# Patient Record
Sex: Male | Born: 1945 | Race: White | Hispanic: No | Smoking: Never smoker
Health system: Southern US, Community
[De-identification: ages and names within clinical notes are randomized; demographics above are authoritative.]

## PROBLEM LIST (undated history)

## (undated) DIAGNOSIS — I10 Essential (primary) hypertension: Secondary | ICD-10-CM

## (undated) DIAGNOSIS — I2510 Atherosclerotic heart disease of native coronary artery without angina pectoris: Secondary | ICD-10-CM

## (undated) DIAGNOSIS — I639 Cerebral infarction, unspecified: Secondary | ICD-10-CM

## (undated) DIAGNOSIS — K219 Gastro-esophageal reflux disease without esophagitis: Secondary | ICD-10-CM

---

## 2020-05-31 ENCOUNTER — Emergency Department (HOSPITAL_COMMUNITY): Admission: EM | Admit: 2020-05-31 | Payer: Medicare PPO | Source: Home / Self Care

## 2020-05-31 ENCOUNTER — Emergency Department (HOSPITAL_COMMUNITY): Payer: Medicare PPO

## 2020-05-31 ENCOUNTER — Emergency Department (HOSPITAL_BASED_OUTPATIENT_CLINIC_OR_DEPARTMENT_OTHER): Payer: Medicare PPO

## 2020-05-31 ENCOUNTER — Observation Stay (HOSPITAL_BASED_OUTPATIENT_CLINIC_OR_DEPARTMENT_OTHER)
Admission: EM | Admit: 2020-05-31 | Discharge: 2020-06-01 | Disposition: A | Discharge status: Home / Self Care | Payer: Medicare PPO | Attending: Family Medicine | Admitting: Family Medicine

## 2020-05-31 ENCOUNTER — Encounter (HOSPITAL_BASED_OUTPATIENT_CLINIC_OR_DEPARTMENT_OTHER): Payer: Self-pay | Admitting: Emergency Medicine

## 2020-05-31 ENCOUNTER — Other Ambulatory Visit: Payer: Self-pay

## 2020-05-31 DIAGNOSIS — Z7982 Long term (current) use of aspirin: Secondary | ICD-10-CM | POA: Insufficient documentation

## 2020-05-31 DIAGNOSIS — Z20822 Contact with and (suspected) exposure to covid-19: Secondary | ICD-10-CM | POA: Insufficient documentation

## 2020-05-31 DIAGNOSIS — Z888 Allergy status to other drugs, medicaments and biological substances status: Secondary | ICD-10-CM | POA: Insufficient documentation

## 2020-05-31 DIAGNOSIS — I480 Paroxysmal atrial fibrillation: Secondary | ICD-10-CM | POA: Diagnosis not present

## 2020-05-31 DIAGNOSIS — Z882 Allergy status to sulfonamides status: Secondary | ICD-10-CM | POA: Insufficient documentation

## 2020-05-31 DIAGNOSIS — G459 Transient cerebral ischemic attack, unspecified: Principal | ICD-10-CM | POA: Diagnosis present

## 2020-05-31 DIAGNOSIS — I10 Essential (primary) hypertension: Secondary | ICD-10-CM

## 2020-05-31 DIAGNOSIS — Z8673 Personal history of transient ischemic attack (TIA), and cerebral infarction without residual deficits: Secondary | ICD-10-CM | POA: Diagnosis not present

## 2020-05-31 DIAGNOSIS — R531 Weakness: Secondary | ICD-10-CM | POA: Diagnosis present

## 2020-05-31 DIAGNOSIS — Z79899 Other long term (current) drug therapy: Secondary | ICD-10-CM | POA: Insufficient documentation

## 2020-05-31 DIAGNOSIS — I2510 Atherosclerotic heart disease of native coronary artery without angina pectoris: Secondary | ICD-10-CM

## 2020-05-31 DIAGNOSIS — R202 Paresthesia of skin: Secondary | ICD-10-CM

## 2020-05-31 HISTORY — DX: Gastro-esophageal reflux disease without esophagitis: K21.9

## 2020-05-31 HISTORY — DX: Essential (primary) hypertension: I10

## 2020-05-31 HISTORY — DX: Atherosclerotic heart disease of native coronary artery without angina pectoris: I25.10

## 2020-05-31 HISTORY — DX: Cerebral infarction, unspecified: I63.9

## 2020-05-31 LAB — ETHANOL: Alcohol, Ethyl (B): <10 mg/dL (ref <10)

## 2020-05-31 LAB — CBC
HCT: 44.3 % (ref 39.0–52.0)
Hemoglobin: 15.8 g/dL (ref 13.0–17.0)
MCH: 30.4 pg (ref 26.0–34.0)
MCHC: 35.7 g/dL (ref 30.0–36.0)
MCV: 85.2 fL (ref 80.0–100.0)
Platelets: 179 10*3/uL (ref 150–400)
RBC: 5.2 MIL/uL (ref 4.22–5.81)
RDW: 13.2 % (ref 11.5–15.5)
WBC: 10.5 10*3/uL (ref 4.0–10.5)
nRBC: 0 % (ref 0.0–0.2)

## 2020-05-31 LAB — RESP PANEL BY RT-PCR (FLU A&B, COVID) ARPGX2
Influenza A by PCR: NEGATIVE
Influenza B by PCR: NEGATIVE
SARS Coronavirus 2 by RT PCR: NEGATIVE

## 2020-05-31 LAB — DIFFERENTIAL
Abs Immature Granulocytes: 0.03 10*3/uL (ref 0.00–0.07)
Basophils Absolute: 0.1 10*3/uL (ref 0.0–0.1)
Basophils Relative: 1 %
Eosinophils Absolute: 0.3 10*3/uL (ref 0.0–0.5)
Eosinophils Relative: 3 %
Immature Granulocytes: 0 %
Lymphocytes Relative: 19 %
Lymphs Abs: 2 10*3/uL (ref 0.7–4.0)
Monocytes Absolute: 0.6 10*3/uL (ref 0.1–1.0)
Monocytes Relative: 5 %
Neutro Abs: 7.6 10*3/uL (ref 1.7–7.7)
Neutrophils Relative %: 72 %

## 2020-05-31 LAB — COMPREHENSIVE METABOLIC PANEL
ALT: 18 U/L (ref 0–44)
AST: 27 U/L (ref 15–41)
Albumin: 3.8 g/dL (ref 3.5–5.0)
Alkaline Phosphatase: 79 U/L (ref 38–126)
Anion gap: 10 (ref 5–15)
BUN: 27 mg/dL — ABNORMAL HIGH (ref 8–23)
CO2: 21 mmol/L — ABNORMAL LOW (ref 22–32)
Calcium: 8.7 mg/dL — ABNORMAL LOW (ref 8.9–10.3)
Chloride: 105 mmol/L (ref 98–111)
Creatinine, Ser: 1.2 mg/dL (ref 0.61–1.24)
GFR, Estimated: >60 mL/min (ref >60)
Glucose, Bld: 130 mg/dL — ABNORMAL HIGH (ref 70–99)
Potassium: 3.5 mmol/L (ref 3.5–5.1)
Sodium: 136 mmol/L (ref 135–145)
Total Bilirubin: 0.5 mg/dL (ref 0.3–1.2)
Total Protein: 7 g/dL (ref 6.5–8.1)

## 2020-05-31 LAB — APTT: aPTT: 29 seconds (ref 24–36)

## 2020-05-31 LAB — URINALYSIS, ROUTINE W REFLEX MICROSCOPIC
Bilirubin Urine: NEGATIVE
Glucose, UA: NEGATIVE mg/dL
Hgb urine dipstick: NEGATIVE
Ketones, ur: NEGATIVE mg/dL
Leukocytes,Ua: NEGATIVE
Nitrite: NEGATIVE
Protein, ur: NEGATIVE mg/dL
Specific Gravity, Urine: 1.02 (ref 1.005–1.030)
pH: 5.5 (ref 5.0–8.0)

## 2020-05-31 LAB — RAPID URINE DRUG SCREEN, HOSP PERFORMED
Amphetamines: NOT DETECTED
Barbiturates: NOT DETECTED
Benzodiazepines: NOT DETECTED
Cocaine: NOT DETECTED
Opiates: NOT DETECTED
Tetrahydrocannabinol: NOT DETECTED

## 2020-05-31 LAB — PROTIME-INR
INR: 1 (ref 0.8–1.2)
Prothrombin Time: 13 seconds (ref 11.4–15.2)

## 2020-05-31 IMAGING — MR MR HEAD W/O CM
10 of 12 series · 32 of 48 positions shown · non-contrast
Comparison: Head CT earlier same day.

CLINICAL DATA: Extremity weakness beginning abruptly. Left facial
numbness. Some slurred speech.

EXAM:
MRI HEAD WITHOUT CONTRAST
MRA HEAD WITHOUT CONTRAST
TECHNIQUE: Multiplanar, multiecho pulse sequences of the brain and surrounding
structures were obtained without intravenous contrast. Angiographic
images of the head were obtained using MRA technique without
contrast.

[Series 5: DWI · axial · 3.0mm · 0.88mm/px · z∈[-71,+79]mm · 7 of 102 slices shown (1 of 4)]
[im 1/102]
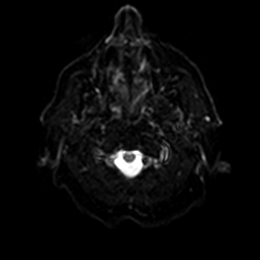
[im 17/102]
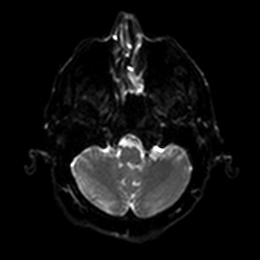
[im 34/102]
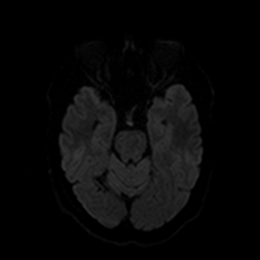
[im 51/102]
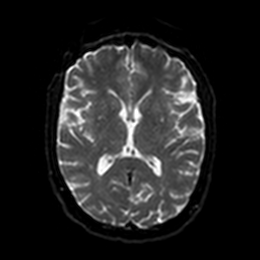
[im 68/102]
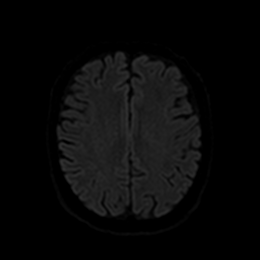
[im 85/102]
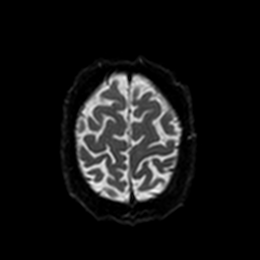
[im 102/102]
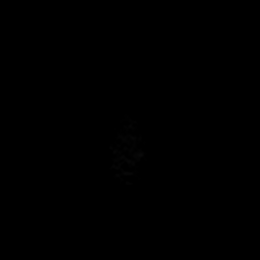

[Series 6: DWI · axial · 3.0mm · 0.88mm/px · z∈[-71,+79]mm · 3 of 48 slices shown (2 of 4)]
[im 1/48]
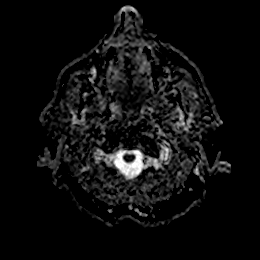
[im 24/48]
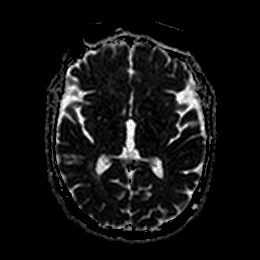
[im 48/48]
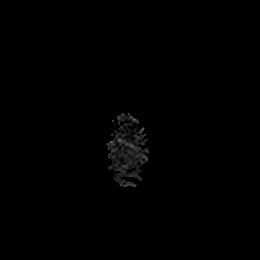

[Series 7: DWI · coronal · 4.0mm · 0.88mm/px · 4 of 64 slices shown (3 of 4)]
[im 1/64]
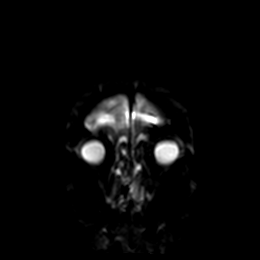
[im 22/64]
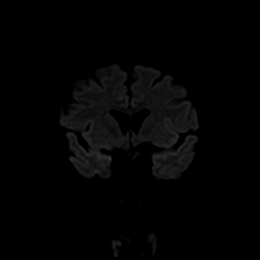
[im 43/64]
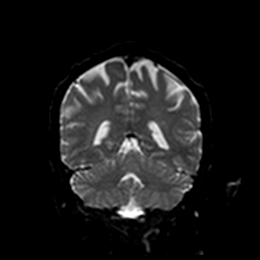
[im 64/64]
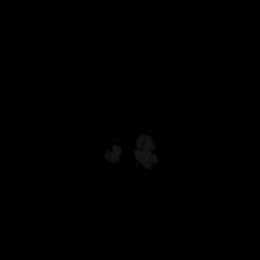

[Series 8: DWI · coronal · 4.0mm · 0.88mm/px · 2 of 32 slices shown (4 of 4)]
[im 1/32]
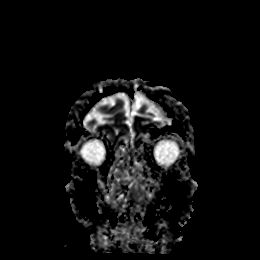
[im 32/32]
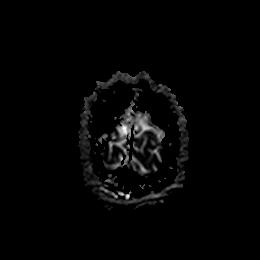

[Series 13: T1 · sagittal · 5.0mm · 0.75mm/px · 2 of 23 slices shown]
[im 1/23]
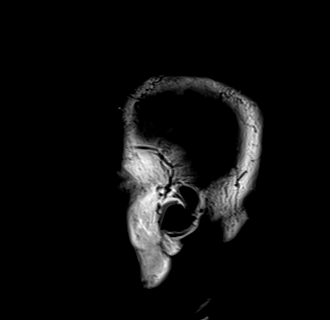
[im 23/23]
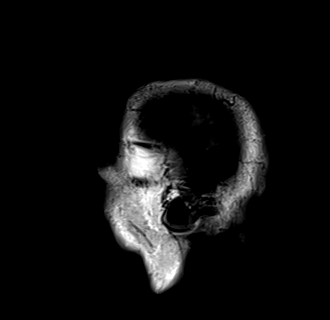

[Series 14: T2 · axial · 5.0mm · 0.72mm/px · z∈[-63,+81]mm · 2 of 25 slices shown (1 of 2)]
[im 1/25]
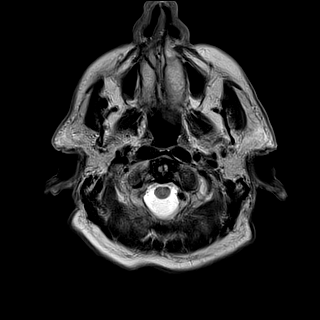
[im 25/25]
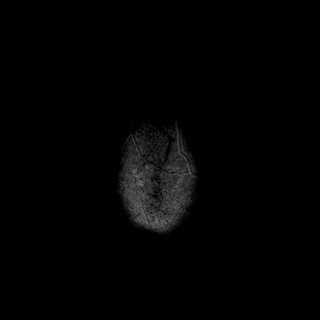

[Series 15: FLAIR · axial · 5.0mm · 0.45mm/px · z∈[-61,+82]mm · 2 of 25 slices shown]
[im 1/25]
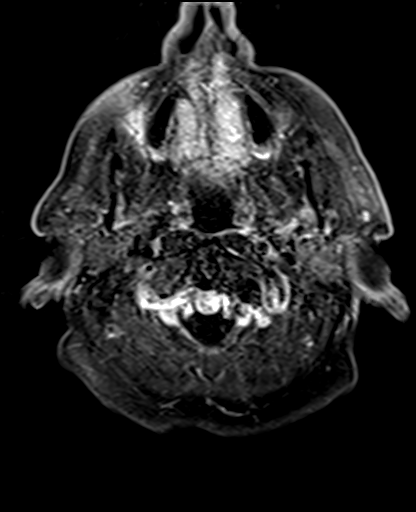
[im 25/25]
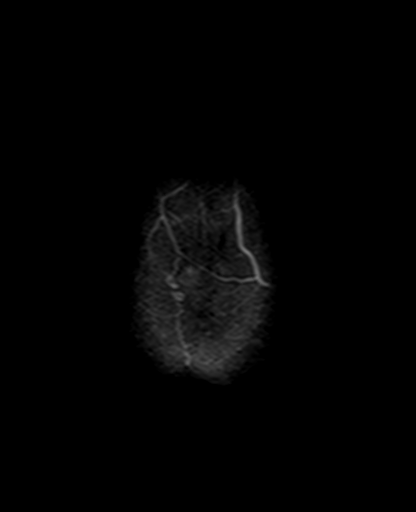

[Series 17: pha_images · axial · 3.0mm · 0.90mm/px · z∈[-78,+93]mm · 4 of 57 slices shown]
[im 1/57]
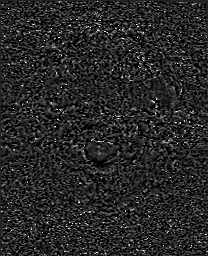
[im 19/57]
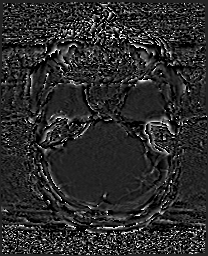
[im 38/57]
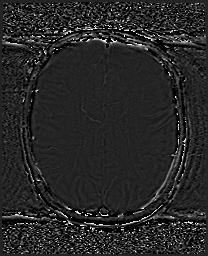
[im 57/57]
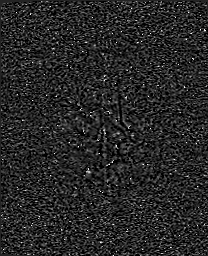

[Series 18: swi_images · axial · 3.0mm · 0.90mm/px · z∈[-78,+99]mm · 4 of 60 slices shown]
[im 1/60]
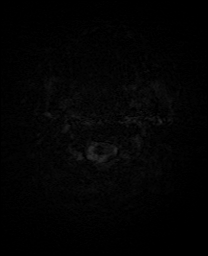
[im 20/60]
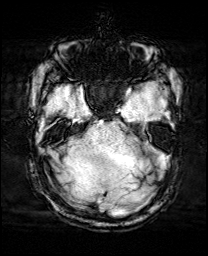
[im 40/60]
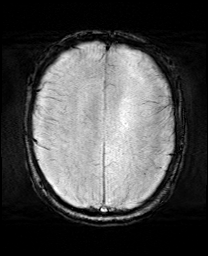
[im 60/60]
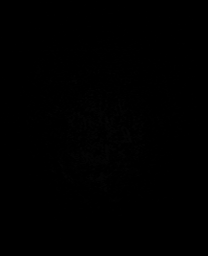

[Series 21: T2 · coronal · 5.0mm · 0.34mm/px · 2 of 29 slices shown (2 of 2)]
[im 1/29]
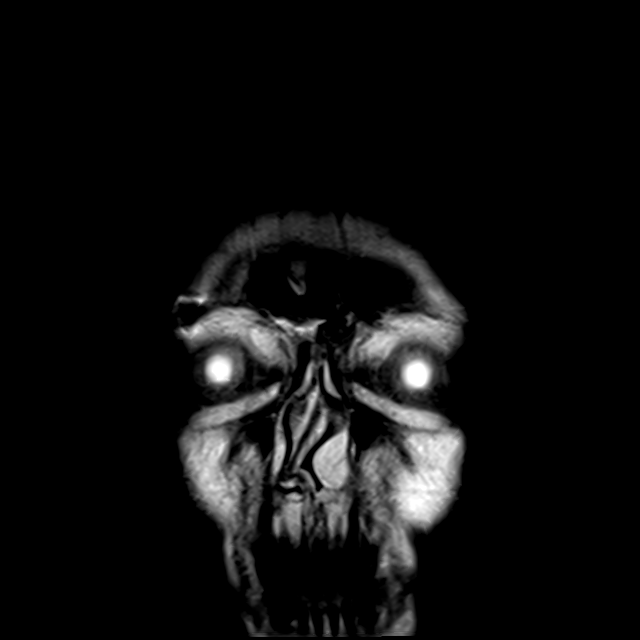
[im 29/29]
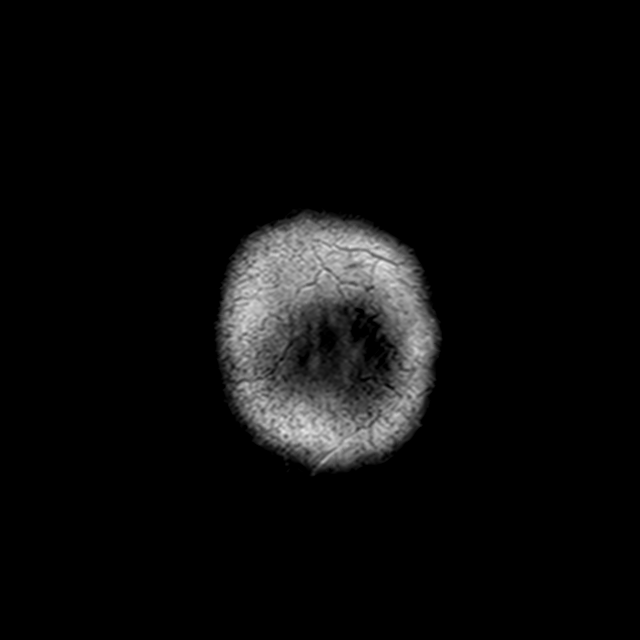

[32 of 48 positions shown; findings below may reference images not displayed]

FINDINGS: MRI HEAD FINDINGS

Brain: Diffusion imaging does not show any acute or subacute
infarction. No focal brainstem abnormality. There are old small
vessel cerebellar infarctions affecting both cerebellar hemispheres.
Cerebral hemispheres show minimal small vessel change of the white
matter. No large vessel territory supratentorial stroke. No mass,
hemorrhage, hydrocephalus or extra-axial collection.

Vascular: Major vessels at the base of the brain show flow.

Skull and upper cervical spine: Negative

Sinuses/Orbits: Clear/normal

Other: None

MRA HEAD FINDINGS

Both internal carotid arteries are widely patent into the brain. No
siphon stenosis. The anterior and middle cerebral vessels are patent
without proximal stenosis, aneurysm or vascular malformation.

Both vertebral arteries are widely patent to the basilar. No basilar
stenosis. Posterior circulation branch vessels appear normal.
IMPRESSION: 1. No acute finding by MRI. Old small vessel cerebellar infarctions.
Minimal small vessel change of the hemispheric white matter.
2. Normal intracranial MR angiography of the large and medium size
vessels.

## 2020-05-31 IMAGING — MR MR MRA HEAD W/O CM
7 of 8 series · 32 of 48 positions shown · non-contrast
Comparison: None.

CLINICAL DATA: Extremity weakness.  Left facial droop.

EXAM:
MRA HEAD WITHOUT CONTRAST
TECHNIQUE: Angiographic images of the Circle of Willis were obtained using MRA
technique without intravenous contrast.

[Series 5: DWI · axial · 3.0mm · 0.88mm/px · z∈[-71,+79]mm · 10 of 102 slices shown (1 of 4)]
[im 1/102]
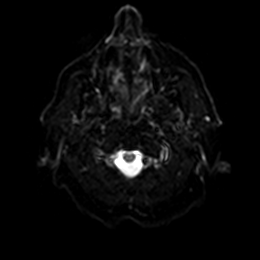
[im 12/102]
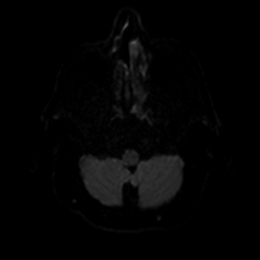
[im 23/102]
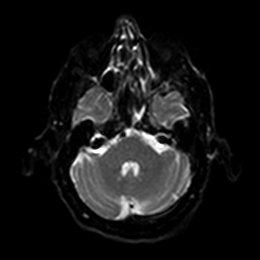
[im 34/102]
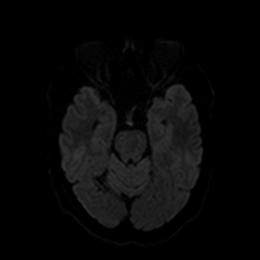
[im 45/102]
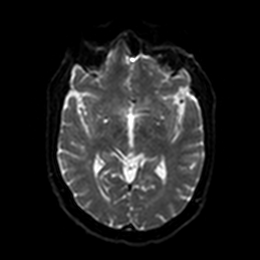
[im 57/102]
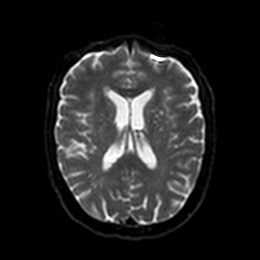
[im 68/102]
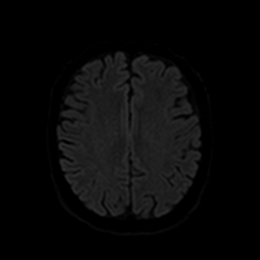
[im 79/102]
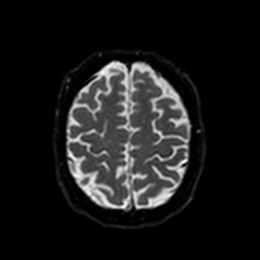
[im 90/102]
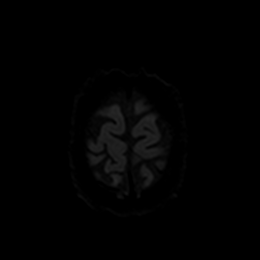
[im 102/102]
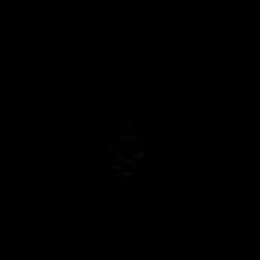

[Series 6: DWI · axial · 3.0mm · 0.88mm/px · z∈[-71,+79]mm · 4 of 48 slices shown (2 of 4)]
[im 1/48]
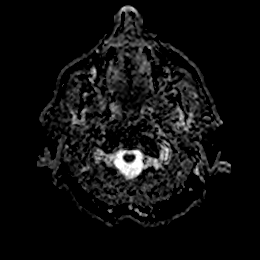
[im 16/48]
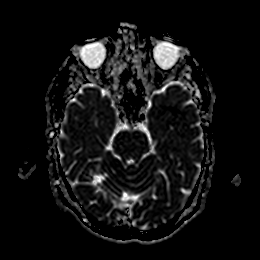
[im 32/48]
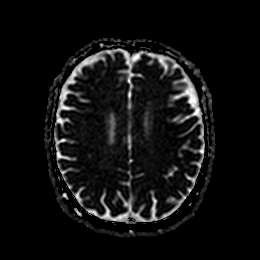
[im 48/48]
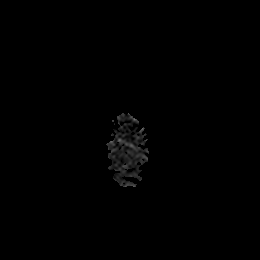

[Series 7: DWI · coronal · 4.0mm · 0.88mm/px · 6 of 64 slices shown (3 of 4)]
[im 1/64]
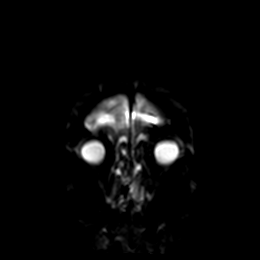
[im 13/64]
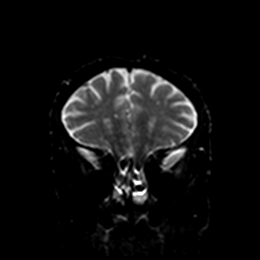
[im 26/64]
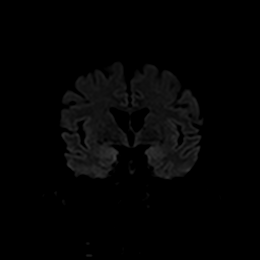
[im 38/64]
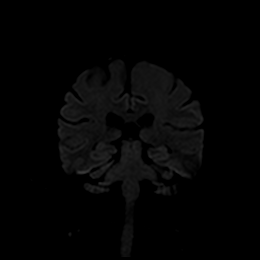
[im 51/64]
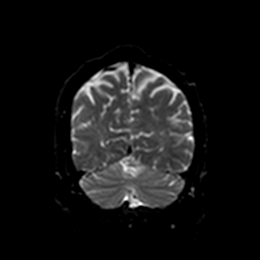
[im 64/64]
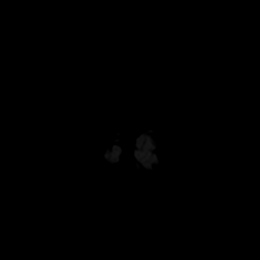

[Series 8: DWI · coronal · 4.0mm · 0.88mm/px · 3 of 32 slices shown (4 of 4)]
[im 1/32]
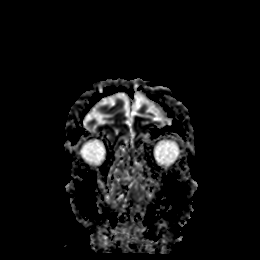
[im 16/32]
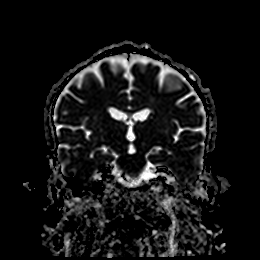
[im 32/32]
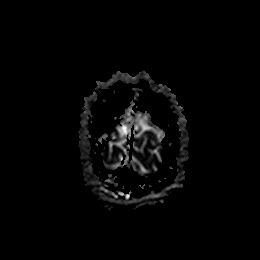

[Series 13: T1 · sagittal · 5.0mm · 0.75mm/px · 2 of 23 slices shown]
[im 1/23]
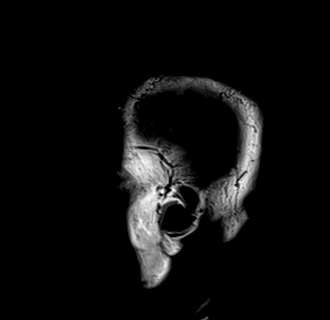
[im 23/23]
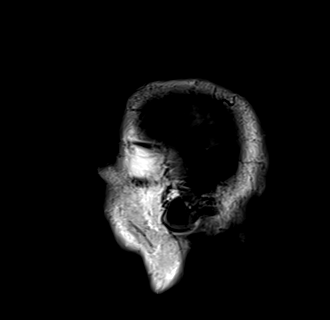

[Series 14: T2 · axial · 5.0mm · 0.72mm/px · z∈[-63,+81]mm · 2 of 25 slices shown]
[im 1/25]
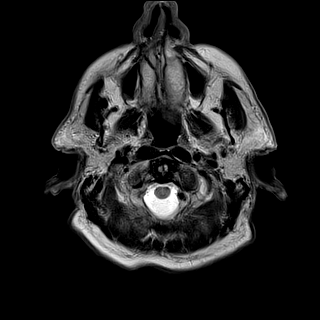
[im 25/25]
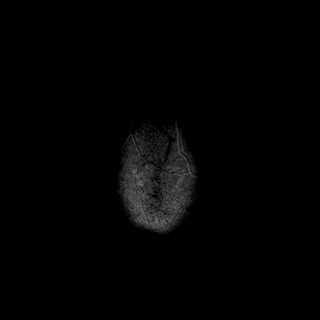

[Series 17: pha_images · axial · 3.0mm · 0.90mm/px · z∈[-78,+93]mm · 5 of 57 slices shown]
[im 1/57]
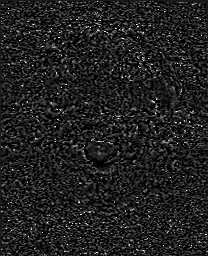
[im 15/57]
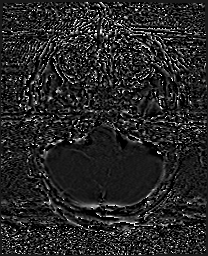
[im 29/57]
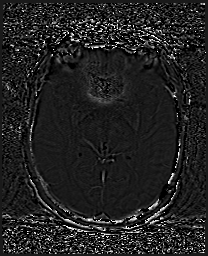
[im 43/57]
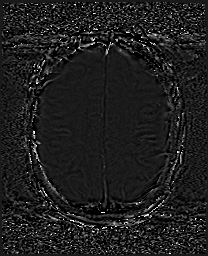
[im 57/57]
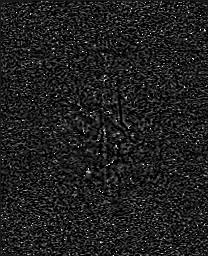

[32 of 48 positions shown; findings below may reference images not displayed]

FINDINGS: POSTERIOR CIRCULATION:

--Vertebral arteries: Normal

--Inferior cerebellar arteries: Normal.

--Basilar artery: Normal.

--Superior cerebellar arteries: Normal.

--Posterior cerebral arteries: Normal.

ANTERIOR CIRCULATION:

--Intracranial internal carotid arteries: Normal.

--Anterior cerebral arteries (ACA): Normal.

--Middle cerebral arteries (MCA): Normal.

ANATOMIC VARIANTS: None
IMPRESSION: Normal intracranial MRA.

## 2020-05-31 IMAGING — CT CT HEAD W/O CM
3 series · 16 of 47 positions shown, 19 images · non-contrast
Comparison: None.

CLINICAL DATA: Left-sided facial numbness and right arm weakness

EXAM:
CT HEAD WITHOUT CONTRAST
TECHNIQUE: Contiguous axial images were obtained from the base of the skull
through the vertex without intravenous contrast.

[Series 2: head wo · axial · 0.41mm/px · z∈[-169,-34]mm · 10 of 33 slices shown, 13 images]
[im 3/33  brain]
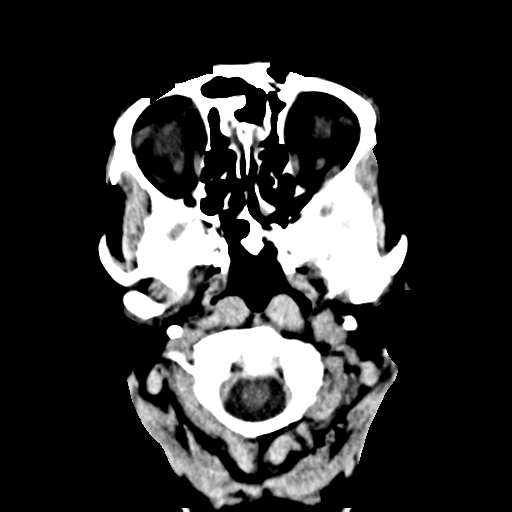
[im 3/33  bone]
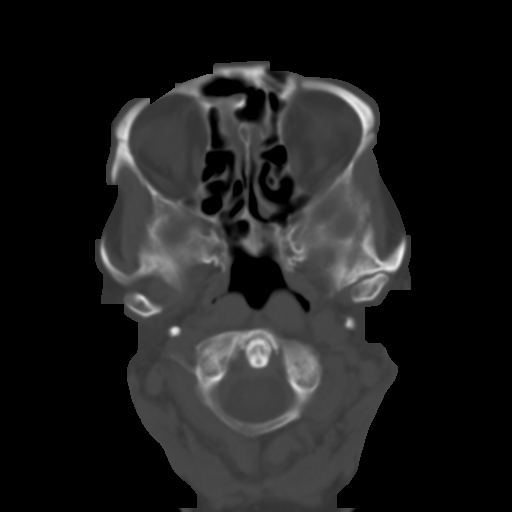
[im 6/33  brain]
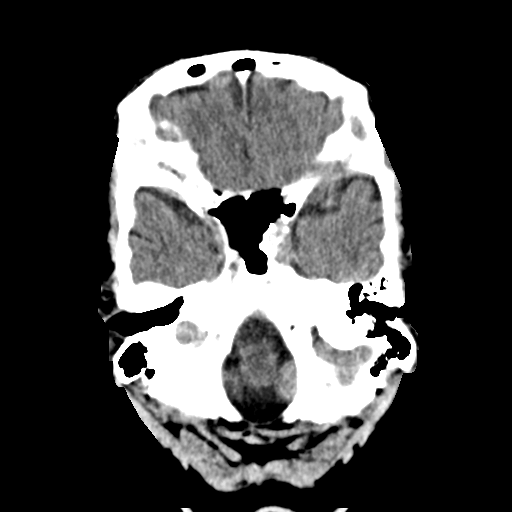
[im 9/33  brain]
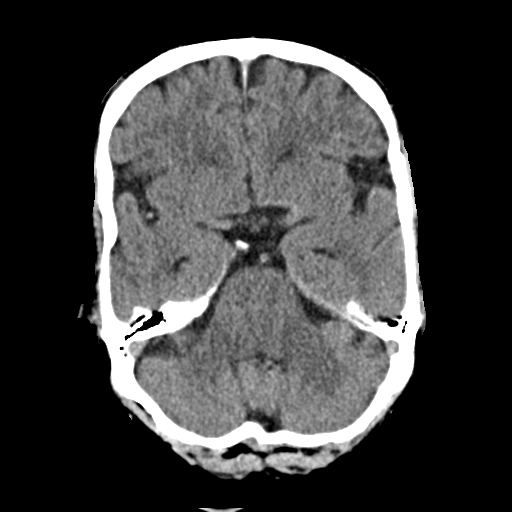
[im 12/33  brain]
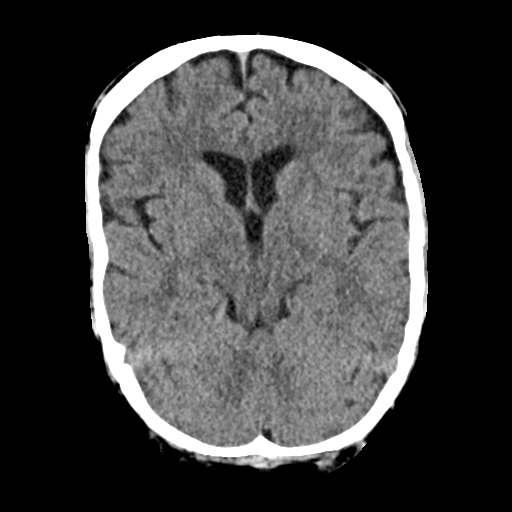
[im 15/33  brain]
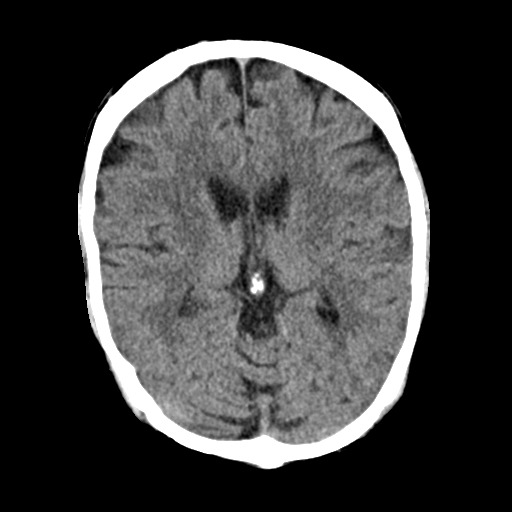
[im 15/33  bone]
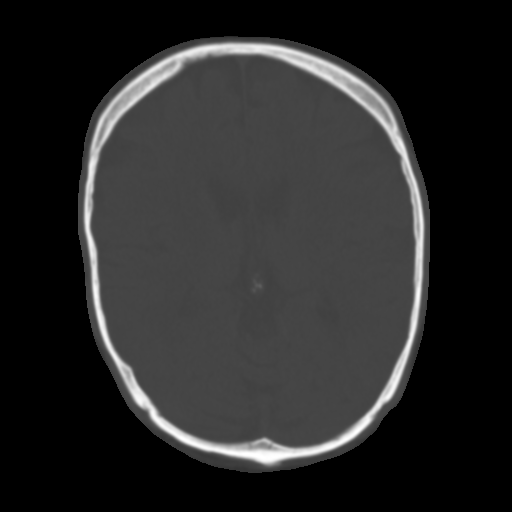
[im 18/33  brain]
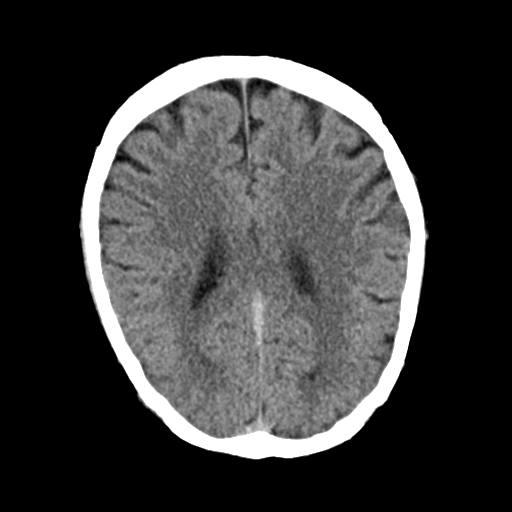
[im 21/33  brain]
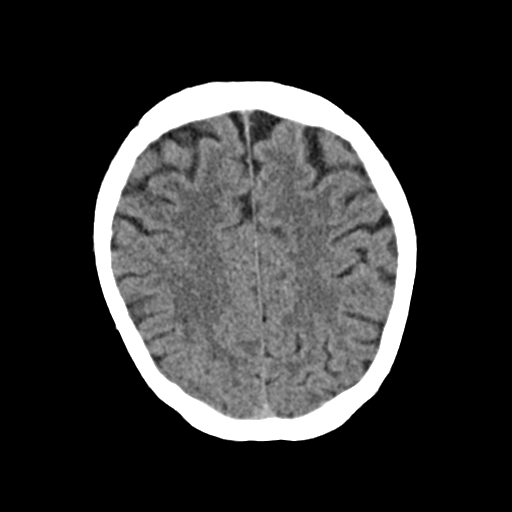
[im 25/33  brain]
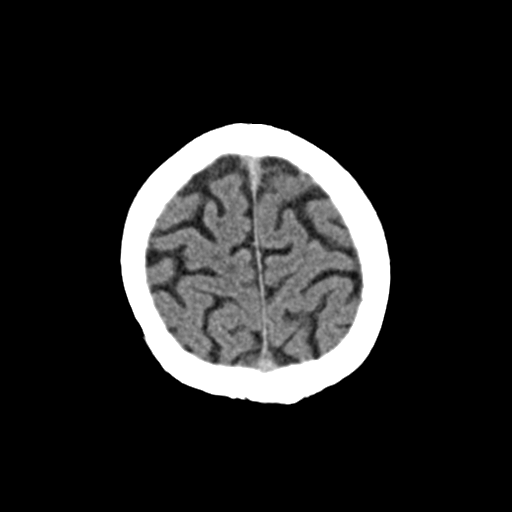
[im 27/33  brain]
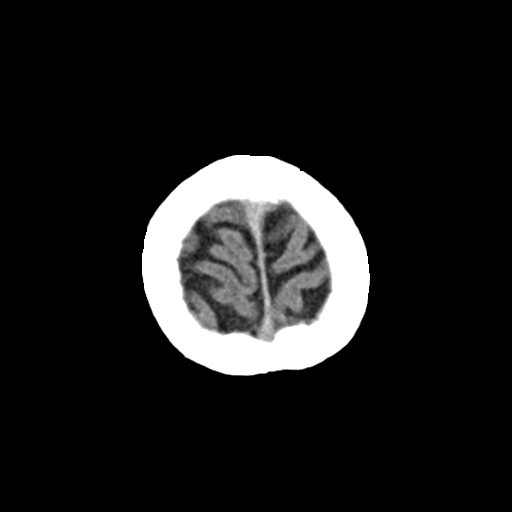
[im 27/33  bone]
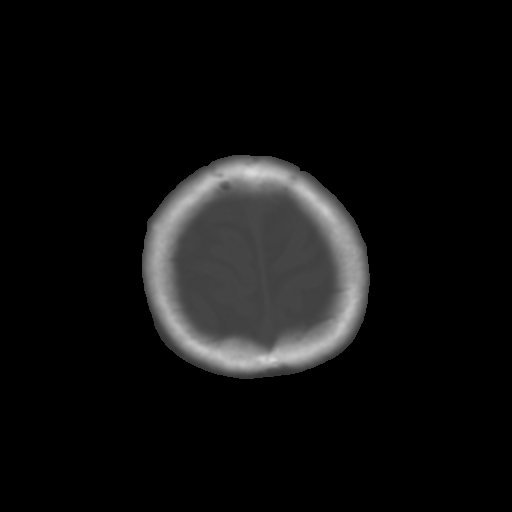
[im 30/33  brain]
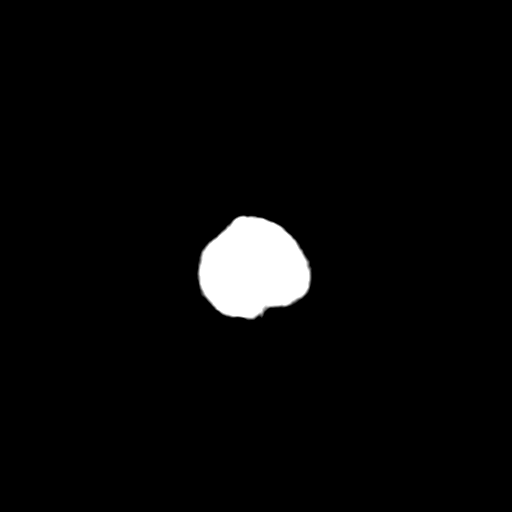

[Series 4: coronal soft · coronal · 0.33mm/px · 3 of 66 slices shown]
[im 22/66  brain]
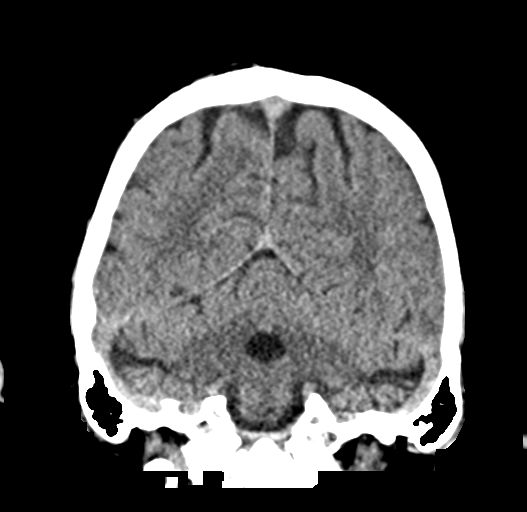
[im 29/66  brain]
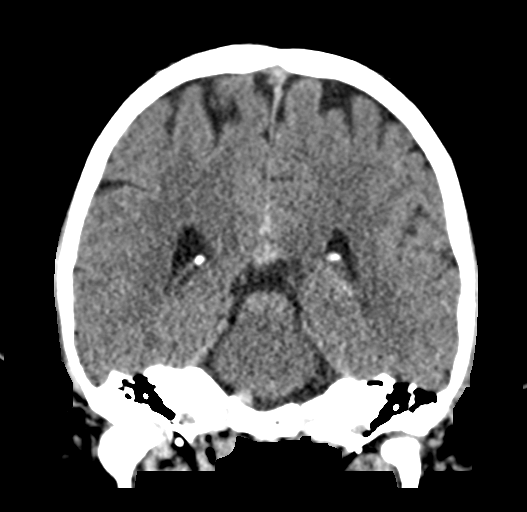
[im 37/66  brain]
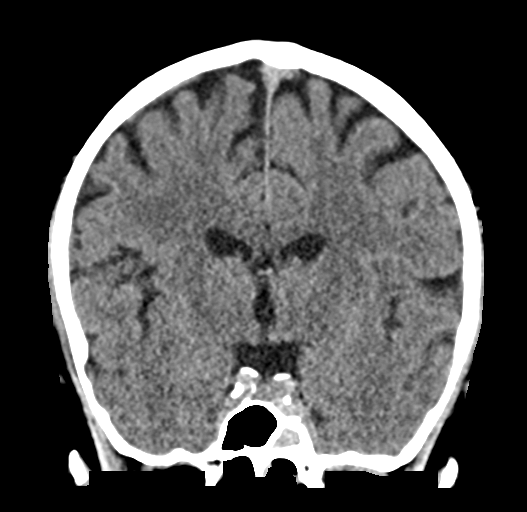

[Series 5: sag soft · sagittal · 0.33mm/px · 3 of 53 slices shown]
[im 18/53  brain]
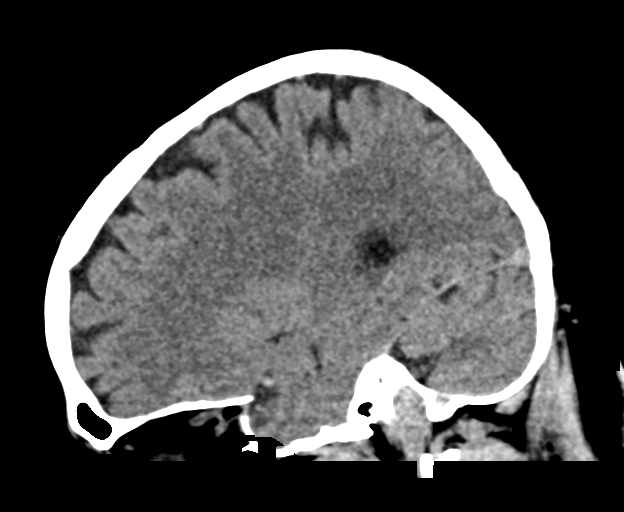
[im 27/53  brain]
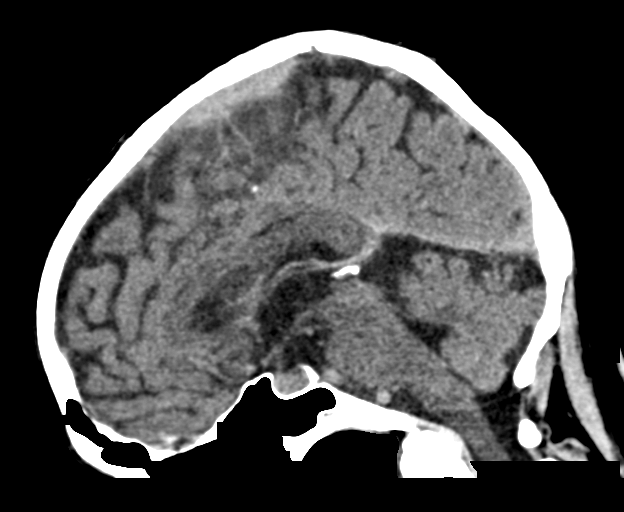
[im 35/53  brain]
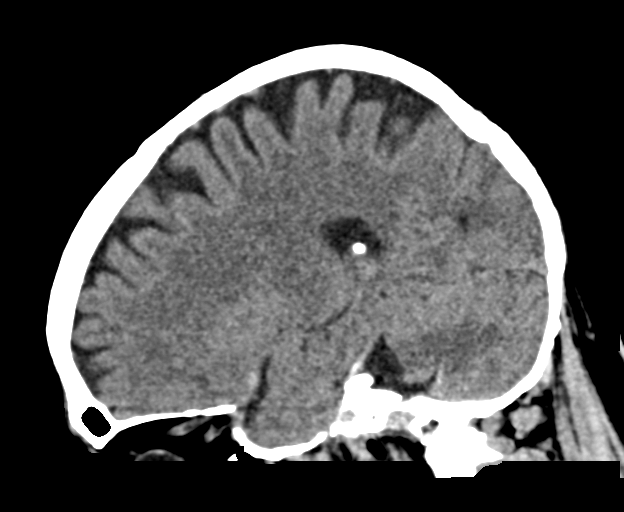

[16 of 47 positions shown; findings below may reference images not displayed]

FINDINGS: Brain: No evidence of acute infarction, hemorrhage, hydrocephalus,
extra-axial collection or mass lesion/mass effect.

Vascular: Atherosclerotic calcifications involving the large vessels
of the skull base. No unexpected hyperdense vessel.

Skull: Normal. Negative for fracture or focal lesion.

Sinuses/Orbits: Scattered mucosal thickening within the bilateral
ethmoid air cells and left sphenoid sinus.

Other: None.
IMPRESSION: 1. No acute intracranial findings.
2. Mild paranasal sinus disease.

## 2020-05-31 MED ORDER — ACETAMINOPHEN 325 MG PO TABS: 650.0000 mg | ORAL_TABLET | ORAL | Status: DC | PRN

## 2020-05-31 MED ORDER — ACETAMINOPHEN 160 MG/5ML PO SOLN: 650.0000 mg | ORAL | Status: DC | PRN

## 2020-05-31 MED ORDER — SENNOSIDES-DOCUSATE SODIUM 8.6-50 MG PO TABS: 1.0000 | ORAL_TABLET | Freq: Every evening | ORAL | Status: DC | PRN

## 2020-05-31 MED ORDER — ENOXAPARIN SODIUM 40 MG/0.4ML ~~LOC~~ SOLN: 40.0000 mg | Freq: Every day | SUBCUTANEOUS | Status: DC

## 2020-05-31 MED ORDER — STROKE: EARLY STAGES OF RECOVERY BOOK: Freq: Once | Status: DC

## 2020-05-31 MED ORDER — ACETAMINOPHEN 650 MG RE SUPP
650.0000 mg | RECTAL | Status: DC | PRN
Start: 2020-05-31 — End: 2020-06-01

## 2020-05-31 NOTE — ED Notes (Signed)
Phone Handoff Report given to Jasper Riling at [employer]

## 2020-05-31 NOTE — Consult Note (Addendum)
[facility] Telemedicine Consult  Consult Requested by: Dr. Rubin Payor  Reason for Consult: TIA  Consult Date: 05/31/20   The history was obtained from the pt.  During history and examination, all items were able to obtain unless otherwise noted.  History of Present Illness:  Troy Powers is a 75 y.o. Caucasian male with PMH of HTN, HLD, stroke in 2008 with left side symptoms but resolved over time, CAD s/p stent and PAF on ASA presented to ER for transient right hand weakness and left facial numbness. He stated that he was doing well at 5am and then when he tried to pick up the coffee pot using right hand, he dropped to pot due to weakness. That lasted briefly and resolved. Then he went to work and he felt left facial numbness lasted about . Co-worker also felt he had slurry speech which he did not notice. He was sent to ER for evaluation. In ER, all symptoms resolved. CT negative.   Pt has PAF which documented on his cardiology note on 05/07/16. He was started on amiodarone but did not mention anticoagulation. Per pt, he was on [facility] before (not known the name but said maybe plavix??) but caused his to have easy bleeding and he then was changed to ASA 81.   LSN: 5am tPA Given: No: outside window, symptoms resolved.   Past Medical History:  Diagnosis Date  . Coronary artery disease   . GERD (gastroesophageal reflux disease)   . Hypertension   . Stroke (cerebrum) [facility])     History reviewed. No pertinent surgical history.  History reviewed. No pertinent family history.  Social History:  reports that he has never smoked. He has never used smokeless tobacco. He reports previous alcohol use. He reports that he does not use drugs.  Allergies:  Allergies  Allergen Reactions  . Iodine   . Lisinopril   . Sulfa Antibiotics     No current facility-administered medications on file prior to encounter.   No current outpatient medications on file prior to encounter.    Review  of Systems: A full ROS was attempted today and was able to be performed.  Systems assessed include - Constitutional, Eyes, HENT, Respiratory, Cardiovascular, Gastrointestinal, Genitourinary, Integument/breast, Hematologic/lymphatic, Musculoskeletal, Neurological, Behavioral/Psych, Endocrine, Allergic/Immunologic - with pertinent responses as per HPI.  Physical Examination: Temp:  [97.6 F (36.4 C)] 97.6 F (36.4 C) (03/10 1052) Pulse Rate:  [58-70] 59 (03/10 1328) Resp:  [18-22] 18 (03/10 1328) BP: (135-165)/(76-98) 165/98 (03/10 1328) SpO2:  [95 %-100 %] 99 % (03/10 1328) Weight:  [74.8 kg] 74.8 kg (03/10 1053)  General - well nourished, well developed, in no apparent distress.    Ophthalmologic - fundi not visualized due to noncooperation.    Cardiovascular - regular rhythm and rate  Mental Status -  Level of arousal and orientation to time, place, and person were intact. Language including expression, naming, repetition, comprehension, reading was assessed and found intact. Fund of Knowledge was assessed and was intact.  Cranial Nerves II - XII - II - Vision intact OU. III, IV, VI - Extraocular movements intact. V - Facial sensation intact bilaterally. VII - mild right nasolabial fold flattening. VIII - Hearing & vestibular intact bilaterally. X - Palate elevates symmetrically. XI - Chin turning & shoulder shrug intact bilaterally. XII - Tongue protrusion intact.  Motor Strength - The patient's strength was normal in all extremities and pronator drift was absent.   Motor Tone & Bulk - Muscle tone was assessed at the  neck and appendages and was normal.  Bulk was normal and fasciculations were absent.   Reflexes - The patient's reflexes were normal in all extremities and he had no pathological reflexes.  Sensory - Light touch, temperature/pinprick were assessed and were normal.    Coordination - The patient had normal movements in the hands with no ataxia or dysmetria.   Tremor was absent.  Gait and Station - deferred  NIH Stroke Scale  Level Of Consciousness 0=Alert; keenly responsive 1=Arouse to minor stimulation 2=Requires repeated stimulation to arouse or movements to pain 3=postures or unresponsive 0  LOC Questions to Month and Age 38=Answers both questions correctly 1=Answers one question correctly or dysarthria/intubated/trauma/language barrier 2=Answers neither question correctly or aphasia 0  LOC Commands      -Open/Close eyes     -Open/close grip     -Pantomime commands if communication barrier 0=Performs both tasks correctly 1=Performs one task correctly 2=Performs neighter task correctly 0  Best Gaze     -Only assess horizontal gaze 0=Normal 1=Partial gaze palsy 2=Forced deviation, or total gaze paresis 0  Visual 0=No visual loss 1=Partial hemianopia 2=Complete hemianopia 3=Bilateral hemianopia (blind including cortical blindness) 0  Facial Palsy     -Use grimace if obtunded 0=Normal symmetrical movement 1=Minor paralysis (asymmetry) 2=Partial paralysis (lower face) 3=Complete paralysis (upper and lower face) 1  Motor  0=No drift for 10/5 seconds 1=Drift, but does not hit bed 2=Some antigravity effort, hits  bed 3=No effort against gravity, limb falls 4=No movement 0=Amputation/joint fusion Right Arm 0     Leg 0    Left Arm 0     Leg 0  Limb Ataxia     - FNT/HTS 0=Absent or does not understand or paralyzed or amputation/joint fusion 1=Present in one limb 2=Present in two limbs 0  Sensory 0=Normal 1=Mild to moderate sensory loss 2=Severe to total sensory loss or coma/unresponsive 0  Best Language 0=No aphasia, normal 1=Mild to moderate aphasia 2=Severe aphasia 3=Mute, global aphasia, or coma/unresponsive 0  Dysarthria 0=Normal 1=Mild to moderate 2=Severe, unintelligible or mute/anarthric 0=intubated/unable to test 0  Extinction/Neglect 0=No abnormality 1=visual/tactile/auditory/spatia/personal inattention/Extinction  to bilateral simultaneous stimulation 2=Profound neglect/extinction more than 1 modality  0  Total   1     Data Reviewed: CT HEAD WO CONTRAST  Result Date: 05/31/2020 CLINICAL DATA:  Left-sided facial numbness and right arm weakness EXAM: CT HEAD WITHOUT CONTRAST TECHNIQUE: Contiguous axial images were obtained from the base of the skull through the vertex without intravenous contrast. COMPARISON:  None. FINDINGS: Brain: No evidence of acute infarction, hemorrhage, hydrocephalus, extra-axial collection or mass lesion/mass effect. Vascular: Atherosclerotic calcifications involving the large vessels of the skull base. No unexpected hyperdense vessel. Skull: Normal. Negative for fracture or focal lesion. Sinuses/Orbits: Scattered mucosal thickening within the bilateral ethmoid air cells and left sphenoid sinus. Other: None. IMPRESSION: 1. No acute intracranial findings. 2. Mild paranasal sinus disease. Electronically Signed   By: Duanne Guess D.O.   On: 05/31/2020 12:45    Assessment: 75 y.o. male with PMH of HTN, HLD, stroke in 2008 with left side symptoms but resolved over time, CAD s/p stent and PAF on ASA presented to ER for transient right hand weakness and left facial numbness. Time onset 5am, currently symptoms all resolved. NIHSS=1, due to right nasolabial fold flattening, could be his baseline. Pt not tPA candidate given outside window. CT no acute finding. Given pt risk factors, recommend MRI brain, CTA head and neck, with TTE for full stroke work up. After work  up, will need to discuss about anticoagulation given PAF not on [facility] with hx of stroke.   Plan: - Transfer to [facility] for further stroke work up  - Frequent neuro checks - Telemetry monitoring - MRI brain  - CTA head and neck - Echocardiogram  - fasting lipid panel and HgbA1C - Continue ASA for now. Will need to discuss about [facility] after stroke work up - Discussed with Dr. Rubin Payor ED physician - Stroke team to follow  Consult  Participants: me and RN Location of the provider: [facility] Location of the patient: [facility]  This consult was provided via telemedicine with 2-way video and audio communication. The patient/family was informed that care would be provided in this way and agreed to receive care in this manner.   This patient is receiving care for possible acute neurological changes. There was 50 minutes of care by this provider at the time of service, including time for direct evaluation via telemedicine, review of medical records, imaging studies and discussion of findings with providers, the patient and/or family.  Marvel Plan, MD PhD Stroke Neurology 05/31/2020 2:26 PM

## 2020-05-31 NOTE — ED Notes (Signed)
Tele Neuro at bedside 

## 2020-05-31 NOTE — ED Notes (Signed)
MD notified that patient has expressed wishes to go home instead of being admitted.

## 2020-05-31 NOTE — ED Notes (Signed)
ED Provider at bedside. 

## 2020-05-31 NOTE — ED Notes (Signed)
Pt given instruction on how to get to [facility] ED, driven by his coworker, verbalizes importance of staying to get MRI. NAD. Ambulatory to coworkers car

## 2020-05-31 NOTE — H&P (Signed)
History and Physical    Troy Powers WPY:[id number] DOB: [birth year] DOA: 05/31/2020  PCP: Patient, No Pcp Per   Patient coming from: Work  Chief Complaint: Left facial numbness and weakness of right hand  HPI: Troy Powers is a 75 y.o. male with medical history significant for  coronary artery disease, GERD, hypertension, CVA (prior left-sided deficits, resolved) presents with complaint of left facial numbness and right hand weakness this am.  Patient states that he started this morning at 5 AM when he was having his usual cup of coffee and then when he was cleaning up he dropped the coffee pot and noticed that his right arm was numb and right hand was weak. The weakness lasted a few minutes then resolved. Patient got into the car to drive here from [place] for work in [place], states along the drive noticed that the left side of his face felt numb.  This lasted for about 10 minutes he states then the symptoms resolved. Patient arrived at work, his colleagues asked how he was feeling and he states he did not feel very well. Patient's colleagues encouraged him to come to the emergency room, told him that his speech was not normal. He states that symptoms have completely resolved at this time.  He denies changes in gait, currently reports normal speech and normal from strength.  No other complaints or concerns. He has hx of previous stroke a few years ago and has hx of CAD and had stent placed in coronary artery a few years ago. States he feels at his baseline level with no numbness or weakness at this time. Has had MRI head that was negative for acute CVA. Had MRA head which was negative  Review of Systems:  General: Denies fever, chills, weight loss, night sweats.  Denies dizziness.  Denies change in appetite HENT: Denies head trauma, headache, denies change in hearing, tinnitus.  Denies nasal congestion or bleeding.  Denies sore throat, sores in mouth.  Denies difficulty swallowing Eyes: Denies  blurry vision, pain in eye, drainage.  Denies discoloration of eyes. Neck: Denies pain.  Denies swelling.  Denies pain with movement. Cardiovascular: Denies chest pain, palpitations.  Denies edema.  Denies orthopnea Respiratory: Denies shortness of breath, cough.  Denies wheezing.  Denies sputum production Gastrointestinal: Denies abdominal pain, swelling.  Denies nausea, vomiting, diarrhea.  Denies melena.  Denies hematemesis. Musculoskeletal: Denies limitation of movement.  Denies deformity or swelling.  Denies pain.  Denies arthralgias or myalgias. Genitourinary: Denies pelvic pain.  Denies urinary frequency or hesitancy.  Denies dysuria.  Skin: Denies rash.  Denies petechiae, purpura, ecchymosis. Neurological: Reports left facial numbness and right hand weakness this am that lasted for 5-10 minutes then resolved spontaneously. Denies headache.  Denies syncope.  Denies seizure activity. Denies slurred speech, drooping face.  Denies visual change. Psychiatric: Denies depression, anxiety. Denies hallucinations.  Past Medical History:  Diagnosis Date  . Coronary artery disease   . GERD (gastroesophageal reflux disease)   . Hypertension   . Stroke (cerebrum) [facility])     History reviewed. No pertinent surgical history.  Social History  reports that he has never smoked. He has never used smokeless tobacco. He reports previous alcohol use. He reports that he does not use drugs.  Allergies  Allergen Reactions  . Iodine   . Lisinopril   . Sulfa Antibiotics     History reviewed. No pertinent family history.   Prior to Admission medications   Not on File    Physical Exam:  Vitals:   05/31/20 2045 05/31/20 2130 05/31/20 2215 05/31/20 2301  BP: (!) 142/92 138/60 (!) 159/87 (!) 160/113  Pulse: (!) 56 (!) 52 (!) 59 (!) 56  Resp: 12 15 14 17   Temp:      TempSrc:      SpO2: 96% 93% 97% 97%  Weight:      Height:        Constitutional: NAD, calm, comfortable Vitals:   05/31/20 2045  05/31/20 2130 05/31/20 2215 05/31/20 2301  BP: (!) 142/92 138/60 (!) 159/87 (!) 160/113  Pulse: (!) 56 (!) 52 (!) 59 (!) 56  Resp: 12 15 14 17   Temp:      TempSrc:      SpO2: 96% 93% 97% 97%  Weight:      Height:       General: WDWN, Alert and oriented x3.  Eyes: EOMI, PERRL, conjunctivae normal.  Sclera nonicteric HENT:  [place]/AT, external ears normal.  Nares patent without epistasis.  Mucous membranes are moist. Posterior pharynx clear of any exudate or lesions.  Neck: Soft, normal range of motion, supple, no masses, no thyromegaly. Trachea midline Respiratory: clear to auscultation bilaterally, no wheezing, no crackles. Normal respiratory effort. No accessory muscle use.  Cardiovascular: Regular rate and rhythm, no murmurs / rubs / gallops. No extremity edema. 2+ pedal pulses. No carotid bruits.   Abdomen: Soft, no tenderness, nondistended, no rebound or guarding.  No masses palpated. Bowel sounds normoactive Musculoskeletal: FROM. No cyanosis. Normal muscle tone.  Skin: Warm, dry, intact no rashes, lesions, ulcers. No induration Neurologic: CN 2-12 grossly intact.  Normal speech.  Sensation intact, patella DTR +1 bilaterally. Strength 5/5 in all extremities.   Psychiatric: Normal judgment and insight.  Normal mood.    Labs on Admission: I have personally reviewed following labs and imaging studies  CBC: Recent Labs  Lab 05/31/20 1143  WBC 10.5  NEUTROABS 7.6  HGB 15.8  HCT 44.3  MCV 85.2  PLT 179    Basic Metabolic Panel: Recent Labs  Lab 05/31/20 1143  NA 136  K 3.5  CL 105  CO2 21*  GLUCOSE 130*  BUN 27*  CREATININE 1.20  CALCIUM 8.7*    GFR: Estimated Creatinine Clearance: 50.5 mL/min (by C-G formula based on SCr of 1.2 mg/dL).  Liver Function Tests: Recent Labs  Lab 05/31/20 1143  AST 27  ALT 18  ALKPHOS 79  BILITOT 0.5  PROT 7.0  ALBUMIN 3.8    Urine analysis:    Component Value Date/Time   COLORURINE YELLOW 05/31/2020 1143   APPEARANCEUR  CLEAR 05/31/2020 1143   LABSPEC 1.020 05/31/2020 1143   PHURINE 5.5 05/31/2020 1143   GLUCOSEU NEGATIVE 05/31/2020 1143   HGBUR NEGATIVE 05/31/2020 1143   BILIRUBINUR NEGATIVE 05/31/2020 1143   KETONESUR NEGATIVE 05/31/2020 1143   PROTEINUR NEGATIVE 05/31/2020 1143   NITRITE NEGATIVE 05/31/2020 1143   LEUKOCYTESUR NEGATIVE 05/31/2020 1143    Radiological Exams on Admission: CT HEAD WO CONTRAST  Result Date: 05/31/2020 CLINICAL DATA:  Left-sided facial numbness and right arm weakness EXAM: CT HEAD WITHOUT CONTRAST TECHNIQUE: Contiguous axial images were obtained from the base of the skull through the vertex without intravenous contrast. COMPARISON:  None. FINDINGS: Brain: No evidence of acute infarction, hemorrhage, hydrocephalus, extra-axial collection or mass lesion/mass effect. Vascular: Atherosclerotic calcifications involving the large vessels of the skull base. No unexpected hyperdense vessel. Skull: Normal. Negative for fracture or focal lesion. Sinuses/Orbits: Scattered mucosal thickening within the bilateral ethmoid air cells and left  sphenoid sinus. Other: None. IMPRESSION: 1. No acute intracranial findings. 2. Mild paranasal sinus disease. Electronically Signed   By: Duanne GuessNicholas  Plundo D.O.   On: 05/31/2020 12:45   MR ANGIO HEAD WO CONTRAST  Result Date: 05/31/2020 CLINICAL DATA:  Extremity weakness.  Left facial droop. EXAM: MRA HEAD WITHOUT CONTRAST TECHNIQUE: Angiographic images of the Circle of Willis were obtained using MRA technique without intravenous contrast. COMPARISON:  None. FINDINGS: POSTERIOR CIRCULATION: --Vertebral arteries: Normal --Inferior cerebellar arteries: Normal. --Basilar artery: Normal. --Superior cerebellar arteries: Normal. --Posterior cerebral arteries: Normal. ANTERIOR CIRCULATION: --Intracranial internal carotid arteries: Normal. --Anterior cerebral arteries (ACA): Normal. --Middle cerebral arteries (MCA): Normal. ANATOMIC VARIANTS: None IMPRESSION: Normal  intracranial MRA. Electronically Signed   By: Deatra RobinsonKevin  Herman M.D.   On: 05/31/2020 20:16   MR BRAIN WO CONTRAST  Result Date: 05/31/2020 CLINICAL DATA:  Extremity weakness beginning abruptly. Left facial numbness. Some slurred speech. EXAM: MRI HEAD WITHOUT CONTRAST MRA HEAD WITHOUT CONTRAST TECHNIQUE: Multiplanar, multiecho pulse sequences of the brain and surrounding structures were obtained without intravenous contrast. Angiographic images of the head were obtained using MRA technique without contrast. COMPARISON:  Head CT earlier same day. FINDINGS: MRI HEAD FINDINGS Brain: Diffusion imaging does not show any acute or subacute infarction. No focal brainstem abnormality. There are old small vessel cerebellar infarctions affecting both cerebellar hemispheres. Cerebral hemispheres show minimal small vessel change of the white matter. No large vessel territory supratentorial stroke. No mass, hemorrhage, hydrocephalus or extra-axial collection. Vascular: Major vessels at the base of the brain show flow. Skull and upper cervical spine: Negative Sinuses/Orbits: Clear/normal Other: None MRA HEAD FINDINGS Both internal carotid arteries are widely patent into the brain. No siphon stenosis. The anterior and middle cerebral vessels are patent without proximal stenosis, aneurysm or vascular malformation. Both vertebral arteries are widely patent to the basilar. No basilar stenosis. Posterior circulation branch vessels appear normal. IMPRESSION: 1. No acute finding by MRI. Old small vessel cerebellar infarctions. Minimal small vessel change of the hemispheric white matter. 2. Normal intracranial MR angiography of the large and medium size vessels. Electronically Signed   By: Paulina FusiMark  Shogry M.D.   On: 05/31/2020 20:13    EKG: Independently reviewed.  EKG shows normal sinus rhythm with no acute ST change but no acute ST elevation or depression.  QTc 445  Assessment/Plan Principal Problem:   TIA (transient ischemic  attack) Patient be observed on medical telemetry floor for TIA/CVA.  MRI head and MRA head and neck has already been obtained and no acute CVA identified. Obtain echocardiogram to evaluate for PFO, wall motion and ejection fraction. Hypertension of 220/110 will be allowed for 24 hours per stroke protocol.  After which blood pressure will be slowly reduced to goal level. Antiplatelet therapy with aspirin daily. Check lipid panel  Neurochecks per stroke protocol  Active Problems:   Essential hypertension Monitor BP and will make medication recommendation in am after tracking blood pressure readings overnight.     CAD (coronary artery disease) Aspirin daily. Check lipid panel    DVT prophylaxis: Lovenox for DVT prophylaxis Code Status:   Full code Family Communication:  Gnosis and plan discussed with patient.  Patient verbalized understanding agrees with plan.  Initially states he does not want to stay and considers leaving AGAINST MEDICAL ADVICE but after discussion with both myself and the ER physician he has decided to stay to complete work-up Disposition Plan:   Patient is from:  Home  Anticipated DC to:  Home  Anticipated DC  date:  Anticipate less than 2 midnight stay in the hospital  Anticipated DC barriers: No barriers to discharge identified at this time  Consults called:  Neurology, Dr. Otelia Limes Admission status:  Observation   Claudean Severance Chotiner MD [facility]  How to contact the [facility] Attending or Consulting provider 7A - 7P or covering provider during after hours 7P -7A, for this patient?   1. Check the care team in [facility] and look for a) attending/consulting [facility] provider listed and b) the [facility] team listed 2. Log into [email or web address] and use [facility]'s universal password to access. If you do not have the password, please contact the hospital operator. 3. Locate the [facility] provider you are looking for under [facility] and page to a number that you can be directly  reached. 4. If you still have difficulty reaching the provider, please page the [facility] (Director on Call) for the Hospitalists listed on amion for assistance.  05/31/2020, 11:27 PM

## 2020-05-31 NOTE — ED Provider Notes (Signed)
[facility] EMERGENCY DEPARTMENT Provider Note   CSN: [id number] Arrival date & time: 05/31/20  1035     History Chief Complaint  Patient presents with  . Weakness    Troy Powers is a 75 y.o. male.  75 year old male with past medical history of coronary artery disease, GERD, hypertension, CVA (prior left-sided deficits, resolved) presents with complaint of not feeling well today.  Patient states that he started this morning at 5 AM, had his usual cup of coffee and then when he was cleaning up he dropped the coffee pot and noticed that his right arm was numb.  Patient got into the car to drive here from [place], states along the drive noticed that the left side of his face felt numb.  This lasted for about an hour and then the sensation returned to his face with the exception of along his left cheek area which feels different from the right side.  Left hand numbness has also resolved.  Patient arrived at work, his colleagues asked how he was feeling and he states he did not feel very well.  Patient's colleagues encouraged him to come to the emergency room, told him that his speech was not normal.  At this point in time, patient reports altered sensation along his left cheek as well as mild blurry vision in his left eye (blurry vision left eye, normal for him due to dry eyes).  He denies changes in gait, currently reports normal speech and normal from strength.  No other complaints or concerns.        Past Medical History:  Diagnosis Date  . Coronary artery disease   . GERD (gastroesophageal reflux disease)   . Hypertension   . Stroke (cerebrum) (HCC)     There are no problems to display for this patient.   History reviewed. No pertinent surgical history.     History reviewed. No pertinent family history.  Social History   Tobacco Use  . Smoking status: Never Smoker  . Smokeless tobacco: Never Used  Substance Use Topics  . Alcohol use: Not Currently  . Drug  use: Never    Home Medications Prior to Admission medications   Not on File    Allergies    Iodine, Lisinopril, and Sulfa antibiotics  Review of Systems   Review of Systems  Constitutional: Negative for chills and fever.  Eyes: Positive for visual disturbance.  Respiratory: Negative for shortness of breath.   Cardiovascular: Negative for chest pain.  Gastrointestinal: Negative for abdominal pain.  Genitourinary: Negative for dysuria.  Musculoskeletal: Negative for arthralgias and myalgias.  Skin: Negative for wound.  Allergic/Immunologic: Negative for immunocompromised state.  Neurological: Positive for speech difficulty, weakness and numbness. Negative for dizziness and facial asymmetry.  Hematological: Does not bruise/bleed easily.  Psychiatric/Behavioral: Negative for confusion.  All other systems reviewed and are negative.   Physical Exam Updated Vital Signs BP (!) 165/98   Pulse (!) 59   Temp 97.6 F (36.4 C) (Oral)   Resp 18   Ht 5\' 7"  (1.702 m)   Wt 74.8 kg   SpO2 99%   BMI 25.84 kg/m   Physical Exam Vitals and nursing note reviewed.  Constitutional:      General: He is not in acute distress.    Appearance: He is well-developed. He is not diaphoretic.  HENT:     Head: Normocephalic and atraumatic.     Mouth/Throat:     Mouth: Mucous membranes are moist.  Eyes:  Extraocular Movements: Extraocular movements intact.     Pupils: Pupils are equal, round, and reactive to light.  Cardiovascular:     Rate and Rhythm: Normal rate and regular rhythm.     Pulses: Normal pulses.     Heart sounds: Normal heart sounds.  Pulmonary:     Effort: Pulmonary effort is normal.     Breath sounds: Normal breath sounds.  Abdominal:     Palpations: Abdomen is soft.     Tenderness: There is no abdominal tenderness.  Musculoskeletal:        General: No swelling or tenderness.     Cervical back: Neck supple.     Right lower leg: No edema.     Left lower leg: No edema.   Skin:    General: Skin is warm and dry.     Findings: No erythema or rash.  Neurological:     Mental Status: He is alert and oriented to person, place, and time.     Motor: No weakness or pronator drift.     Coordination: Coordination normal. Heel to Shin Test normal. Rapid alternating movements normal.     Comments: Reports altered sensation along left cheek. No facial asymmetry   Psychiatric:        Behavior: Behavior normal.     ED Results / Procedures / Treatments   Labs (all labs ordered are listed, but only abnormal results are displayed) Labs Reviewed  COMPREHENSIVE METABOLIC PANEL - Abnormal; Notable for the following components:      Result Value   CO2 21 (*)    Glucose, Bld 130 (*)    BUN 27 (*)    Calcium 8.7 (*)    All other components within normal limits  RESP PANEL BY RT-PCR (FLU A&B, COVID) ARPGX2  ETHANOL  PROTIME-INR  APTT  CBC  DIFFERENTIAL  RAPID URINE DRUG SCREEN, HOSP PERFORMED  URINALYSIS, ROUTINE W REFLEX MICROSCOPIC    EKG None  Radiology CT HEAD WO CONTRAST  Result Date: 05/31/2020 CLINICAL DATA:  Left-sided facial numbness and right arm weakness EXAM: CT HEAD WITHOUT CONTRAST TECHNIQUE: Contiguous axial images were obtained from the base of the skull through the vertex without intravenous contrast. COMPARISON:  None. FINDINGS: Brain: No evidence of acute infarction, hemorrhage, hydrocephalus, extra-axial collection or mass lesion/mass effect. Vascular: Atherosclerotic calcifications involving the large vessels of the skull base. No unexpected hyperdense vessel. Skull: Normal. Negative for fracture or focal lesion. Sinuses/Orbits: Scattered mucosal thickening within the bilateral ethmoid air cells and left sphenoid sinus. Other: None. IMPRESSION: 1. No acute intracranial findings. 2. Mild paranasal sinus disease. Electronically Signed   By: Duanne Guess D.O.   On: 05/31/2020 12:45    Procedures Procedures   Medications Ordered in  ED Medications - No data to display  ED Course  I have reviewed the triage vital signs and the nursing notes.  Pertinent labs & imaging results that were available during my care of the patient were reviewed by me and considered in my medical decision making (see chart for details).  Clinical Course as of 05/31/20 1449  Thu May 31, 2020  8045 75 year old male with presentation as above, at time of arrival in the ER, found to have left cheek numbness. CT head unremarkable, labs without sniffing finding including CBC, CMP, UDS, UA, negative flu and Covid. Case discussed with Dr. Rubin Payor, ER attending, recommends consulting neuro.  Case discussed with Dr. Roda Shutters with telemetry neuro who will assess the patient, recommends likely transfer  for MRI. [LM]  1445 Patient was seen by Dr. Roda Shutters who recommends transfer to [facility] for MRI and neurology evaluation, monitoring, possible medication adjustment.  Patient refuses EMS transfer and admission however is agreeable to obtaining MRI (not available at this facility).  Patient states that he is from [place] and this hospital system is out of network for him and he does not want to acquire more medical bills.  Patient plans to have someone else drive him to [facility] to have his MRI today.  Once he has those results, he may decide to leave AGAINST MEDICAL ADVICE to have someone else drive him back home for his primary care provider to manage his care or he may decide after his MRI results that he does in fact need to stay in the hospital.  Discussed concerns with patient regarding the episode he had today, if he has had a stroke he may need more emergent treatment or care, without care could lead to permanent disability or death.  Patient was advised not to drive himself which could result in deteriorating of his condition resulting in MVC injuring himself or others.  Patient verbalizes understanding and agrees to have someone else drive him to [facility]  and ultimately back home.  Discussed plan with Dr. Dalene Seltzer, ER attending at [facility] who accepts patient in transfer. [LM]    Clinical Course User Index [LM] Alden Hipp   MDM Rules/Calculators/A&P                          Final Clinical Impression(s) / ED Diagnoses Final diagnoses:  Paresthesia  TIA (transient ischemic attack)    Rx / DC Orders ED Discharge Orders    None       Jeannie Fend, PA-C 05/31/20 1449    Benjiman Core, MD 05/31/20 1452

## 2020-05-31 NOTE — ED Notes (Signed)
Pt returned from MRI °

## 2020-05-31 NOTE — ED Notes (Signed)
Patient transported to MRI 

## 2020-05-31 NOTE — ED Notes (Signed)
Pt instructed to remain NPO.

## 2020-05-31 NOTE — ED Triage Notes (Signed)
States dropped  A coffe pot this am had some extremity weakness and then at 0730 am  Left side of face started to feel weird  ,  Not as bad now but still wfeels funny   No drooping  , his office felt like he  Had slurreed speech

## 2020-05-31 NOTE — ED Provider Notes (Signed)
Patient seen on arrival from our affiliated center.  In no distress.  10:18 PM I discussed the patient's MRI results with him again, and have spoke with our neurology colleagues about him. Given his episode of left facial numbness, concern for possible TIA, recommendations for admission for stroke evaluation. I subsequently discussed this with our internal medicine colleagues, and the patient will be admitted.  He is currently in no distress, is hemodynamically unremarkable, has had no recurrence of his left facial numbness.   Troy Munch, MD 05/31/20 2219

## 2020-06-01 NOTE — ED Notes (Signed)
Pt advised of risks of leaving AMA and attempts to work with pt to stay were ineffective. Pt IV removed. Notified that he can return at any time for any reason.

## 2020-06-01 NOTE — ED Notes (Signed)
Pt becoming increasingly agitated and wanting to leave. MD notified.
